# Patient Record
Sex: Male | Born: 2005
Health system: Southern US, Community
[De-identification: ages and names within clinical notes are randomized; demographics above are authoritative.]

## PROBLEM LIST (undated history)

## (undated) DIAGNOSIS — T7840XA Allergy, unspecified, initial encounter: Secondary | ICD-10-CM

## (undated) HISTORY — PX: CIRCUMCISION: SUR203

## (undated) HISTORY — DX: Allergy, unspecified, initial encounter: T78.40XA

---
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
## 2024-02-11 ENCOUNTER — Encounter (HOSPITAL_BASED_OUTPATIENT_CLINIC_OR_DEPARTMENT_OTHER): Payer: Self-pay

## 2024-02-11 ENCOUNTER — Other Ambulatory Visit: Payer: Self-pay

## 2024-02-11 ENCOUNTER — Emergency Department (HOSPITAL_BASED_OUTPATIENT_CLINIC_OR_DEPARTMENT_OTHER)
Admission: EM | Admit: 2024-02-11 | Discharge: 2024-02-12 | Disposition: A | Discharge status: Home / Self Care | Payer: BC Managed Care – PPO | Attending: Emergency Medicine | Admitting: Emergency Medicine

## 2024-02-11 DIAGNOSIS — Y9365 Activity, lacrosse and field hockey: Secondary | ICD-10-CM | POA: Diagnosis not present

## 2024-02-11 DIAGNOSIS — S0990XA Unspecified injury of head, initial encounter: Secondary | ICD-10-CM | POA: Diagnosis present

## 2024-02-11 DIAGNOSIS — W228XXA Striking against or struck by other objects, initial encounter: Secondary | ICD-10-CM | POA: Insufficient documentation

## 2024-02-11 DIAGNOSIS — S060X0A Concussion without loss of consciousness, initial encounter: Secondary | ICD-10-CM | POA: Insufficient documentation

## 2024-02-11 MED ORDER — OXYCODONE-ACETAMINOPHEN 5-325 MG PO TABS
1.0000 | ORAL_TABLET | ORAL | Status: DC | PRN
  Administered 2024-02-11: 1 via ORAL
  Filled 2024-02-11: qty 1

## 2024-02-11 NOTE — ED Triage Notes (Signed)
 Pt presents via POV c/o head injury while playing lacrosse. Reports hit in head during game. Reports headache, dizziness, and blurred vision.   Reports hx of concussion in past x2

## 2024-02-12 NOTE — ED Provider Notes (Signed)
 [place] EMERGENCY DEPARTMENT AT [facility] Provider Note   CSN: [id number] Arrival date & time: 02/11/24  2137     History  Chief Complaint  Patient presents with   Head Injury    Perry Dougherty is a 18 y.o. male.  The history is provided by the patient and a parent.   Patient presents after sustaining a head injury during a lacrosse game.  This occurred around 7 PM on February 25.  Patient reports that he was "blown up" by another player when he was trying to pass the ball.  No LOC.  He did not fall to the ground, but did start having a headache and blurred vision.  He continued to play and actually scored a goal afterwards. He was able to finish the game and after going home he had worsening headache and nausea.  He has had mild dizziness  no vomiting, no focal weakness, no mental status changes. No staggering, no difficulty walking or slurred speech per father He has been resting since that time and is starting to feel improved.  He reports his vision is back to baseline and no dizziness   Has had 2 previous concussions is been seen by sports medicine previously for this. He is otherwise healthy at baseline Home Medications Prior to Admission medications   Medication Sig Start Date End Date Taking? Authorizing Provider  meloxicam (MOBIC) 15 MG tablet Take 1 tablet (15 mg total) by mouth daily. 05/07/23   Richardean Sale, DO  mupirocin ointment (BACTROBAN) 2 % Apply 1 Application topically 2 (two) times daily. 12/24/23   Myles Gip, DO      Allergies    Patient has no known allergies.    Review of Systems   Review of Systems  HENT:  Negative for hearing loss and tinnitus.   Eyes:  Positive for visual disturbance.  Gastrointestinal:  Positive for nausea. Negative for vomiting.  Neurological:  Positive for dizziness and headaches. Negative for syncope, speech difficulty, weakness and numbness.    Physical Exam Updated Vital Signs BP 115/70    Pulse 75   Temp 98 F (36.7 C) (Oral)   Resp 16   Ht 1.854 m (6\' 1" )   Wt 68 kg   SpO2 99%   BMI 19.79 kg/m  Physical Exam CONSTITUTIONAL: Well developed/well nourished HEAD: Normocephalic/atraumatic, no signs of trauma, no bruising EYES: EOMI/PERRL, no nystagmus,  no ptosis ENMT: Mucous membranes moist, no facial trauma NECK: supple no meningeal signs No cervical spine tenderness Chest-no bruising or tenderness, no abdominal tenderness NEURO:Awake/alert, face symmetric, no arm or leg drift is noted Equal 5/5 strength with shoulder abduction, elbow flex/extension, wrist flex/extension in upper extremities and equal hand grips bilaterally Equal 5/5 strength with hip flexion,knee flex/extension, foot dorsi/plantar flexion Cranial nerves 3/4/5/6/06/24/09/11/12 tested and intact Gait normal without ataxia No past pointing Sensation to light touch intact in all extremities EXTREMITIES: pulses normal, full ROM SKIN: warm, color normal PSYCH: no abnormalities of mood noted  ED Results / Procedures / Treatments   Labs (all labs ordered are listed, but only abnormal results are displayed) Labs Reviewed - No data to display  EKG None  Radiology No results found.  Procedures Procedures    Medications Ordered in ED Medications  oxyCODONE-acetaminophen (PERCOCET/ROXICET) 5-325 MG per tablet 1 tablet (1 tablet Oral Given 02/11/24 2154)    ED Course/ Medical Decision Making/ A&P           Glasgow Coma Scale Score: 15  NEXUS Criteria Score: 0                Medical Decision Making Risk Prescription drug management.   This patient presents to the ED for concern of head injury, this involves an extensive number of treatment options, and is a complaint that carries with it a high risk of complications and morbidity.  The differential diagnosis includes but is not limited to subdural hematoma, subarachnoid hemorrhage, skull fracture, concussion    Comorbidities that  complicate the patient evaluation: Patient's presentation is complicated by their history of previous concussion  Social Determinants of Health: Patient's  currently high school student   increases the complexity of managing their presentation  Additional history obtained: Additional history obtained from family Records reviewed Primary Care Documents  Medicines ordered and prescription drug management: I ordered medication including Percocet for pain Reevaluation of the patient after these medicines showed that the patient    improved  Test Considered: Since it has been several hours since his injury, no LOC or vomiting, GCS of 15 and back to his baseline, no indication for neuroimaging at this time  Reevaluation: After the interventions noted above, I reevaluated the patient and found that they have :improved  Complexity of problems addressed: Patient's presentation is most consistent with  acute presentation with potential threat to life or bodily function  Disposition: After consideration of the diagnostic results and the patient's response to treatment,  I feel that the patent would benefit from discharge   .    No indication for neuroimaging given his history and exam.  He is very well-appearing.  No neurodeficits and he walks around the room in no distress. He was informed that he cannot return to sports and must be seen as an outpatient by his sports medicine physician.  Patient and father were agreeable w/plan        Final Clinical Impression(s) / ED Diagnoses Final diagnoses:  Concussion without loss of consciousness, initial encounter    Rx / DC Orders ED Discharge Orders     None         Zadie Rhine, MD 02/12/24 0139

## 2024-02-13 ENCOUNTER — Ambulatory Visit (INDEPENDENT_AMBULATORY_CARE_PROVIDER_SITE_OTHER): Payer: BC Managed Care – PPO | Admitting: Sports Medicine

## 2024-02-13 VITALS — BP 132/80 | HR 67 | Ht 73.0 in | Wt 150.0 lb

## 2024-02-13 DIAGNOSIS — S060X0A Concussion without loss of consciousness, initial encounter: Secondary | ICD-10-CM | POA: Diagnosis not present

## 2024-02-13 NOTE — Patient Instructions (Addendum)
-   Goal of sleeping a minimum of 7-8 continuous hours nightly -Recommend light physical activity for 15-30 minutes a day while keeping symptoms less than 3/10 -Stop mental or physical activities that cause symptoms to worsen greater than 3/10, and wait 24 hours before attempting them again -Eliminate screen time as much as possible for first 48 hours after concussive event, then continue limited screen time (recommend less than 2 hours per day) Not cleared for athletic participation at this time. Tylenol for daily pain relief. Personally would recommend resting this weekend. Can return to school starting tomorrow with accommodations. Follow up in 1 week.

## 2024-02-13 NOTE — Progress Notes (Signed)
 Aleen Sells D.[facility] [address] [place] [address] Phone: [phone]  Assessment and Plan:     1. Concussion without loss of consciousness, initial encounter  -Acute, complicated, initial sports medicine visit - Concussion diagnosed based on HPI, physical exam, symptom severity score, ER notes - May return to school starting tomorrow with accommodations.  Recommend maximum 1 test/quiz per day.  Take rest breaks as needed.  Extended time for coursework - Not cleared to return to physical activity at this time - Patient has a college visit to [employer].  Can participate, though should use 3/10 threshold and take rest breaks as needed - Can drive as tolerated if asymptomatic - Patient has a youth group trip this weekend which will involve outdoor activities, physical activity planting trees.  Personally I would recommend not participating in this trip as physical activity, new sleeping arrangements could prolong patient's recovery. - Discussed with patient and his mother that this is patient's third diagnosed concussion.  Last diagnosed concussion occurred on 02/27/2022 and had completely resolved within 4 weeks.  We discussed that I do not see any red flag symptoms at this time that make me think the patient should stop lacrosse or physical activity.  If patient has recovered from this concussion in a typical 2 to 4-week timeframe without red flag symptoms, I would provide full clearance to return to lacrosse without restrictions  Date of injury was 02/11/2024.  Original symptom severity scores were 10 and 27.   Recommendations:  -  Goal of sleeping a minimum of 7-8 continuous hours nightly - Recommend light physical activity for 15-30 minutes a day while keeping symptoms less than 3/10 - Stop mental or physical activities that cause symptoms to worsen greater than 3/10, and wait 24 hours before attempting them again - Eliminate screen time as much as  possible for first 48 hours after concussive event, then continue limited screen time (recommend less than 2 hours per day)  Pertinent previous records reviewed include ER note 02/11/24  - Encouraged to RTC in 1 week for reassessment or sooner for any concerns or acute changes   Patient accompanied by his mother throughout entirety of office visit  Time of visit 48 minutes, which included chart review, physical exam, treatment plan, symptom severity score, VOMS, and tandem gait testing being performed, interpreted, and discussed with patient at today's visit.   Subjective:   I, Jerene Canny, am serving as a Neurosurgeon for Doctor Richardean Sale  Chief Complaint: concussion symptoms   HPI:   02/13/24 Patient is a 18 year old male with concussion symptoms . Patient states was seen in ED  sustaining a head injury during a lacrosse game.  This occurred around 7 PM on February 25.  Patient reports that he was "blown up" by another player when he was trying to pass the ball.  No LOC.  He did not fall to the ground, but did start having a headache and blurred vision.  He continued to play and actually scored a goal afterwards.He was able to finish the game and after going home he had worsening headache and nausea.  He has had mild dizziness no vomiting, no focal weakness, no mental status changes. No staggering, no difficulty walking or slurred speech per father He has been resting since that time and is starting to feel improved.  He reports his vision is back to baseline and no dizziness Has had 2 previous concussions is been seen by  sports medicine previously for this. 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 No Diagnosed/treated for headache disorder, migraines, or seizures? No Diagnosed with learning disability Elnita Maxwell? No Diagnosed with ADD/ADHD? No Diagnose with Depression, anxiety, or other Psychiatric Disorder? No   Current medications:  Current Outpatient Medications  Medication Sig Dispense Refill   meloxicam (MOBIC) 15 MG tablet Take 1 tablet (15 mg total) by mouth daily. 30 tablet 0   mupirocin ointment (BACTROBAN) 2 % Apply 1 Application topically 2 (two) times daily. 22 g 0   No current facility-administered medications for this visit.      Objective:     Vitals:   02/13/24 1056  BP: 132/80  Pulse: 67  SpO2: 100%  Weight: 150 lb (68 kg)  Height: 6\' 1"  (1.854 m)      Body mass index is 19.79 kg/m.    Physical Exam:     General: Well-appearing, cooperative, sitting comfortably in no acute distress.  Psychiatric: Mood and affect are appropriate.   Neuro:sensation intact and strength 5/5 with no deficits, no atrophy, normal muscle tone   Today's Symptom Severity Score:  Scores: 0-6  Headache:5 "Pressure in head":4  Neck Pain:0 Nausea or vomiting:2 Dizziness:2 Blurred vision:0 Balance problems:0 Sensitivity to light:6 Sensitivity to noise:2 Feeling slowed down:1 Feeling like "in a fog":0 "Don't feel right":0 Difficulty concentrating:3 Difficulty remembering:2  Fatigue or low energy:0 Confusion:0  Drowsiness:0  More emotional:0 Irritability:0 Sadness:0  Nervous or Anxious:0 Trouble falling or staying asleep:6  Total number of symptoms: 10/22  Symptom Severity index: 27/132  Worse with physical activity? Yes  Worse with mental activity? Yes  Percent improved since injury: 70%    Full pain-free cervical PROM: yes     Cognitive:  - Months backwards: 0 Mistakes.  13 seconds  mVOMS:   - Baseline symptoms: 0 - Horizontal Vestibular-Ocular Reflex: Head pressure 2/10  - Smooth pursuits: 0/10  - Horizontal Saccades:  0/10  -  Visual Motion Sensitivity Test:  0/10  - Convergence: 4,4cm (<5 cm normal)    Autonomic:  - Symptomatic with supine to standing: no  Complex Tandem Gait: - Forward, eyes open: 0 errors - Backward, eyes open: 0 errors - Forward, eyes closed: 3 errors - Backward, eyes closed: 4 errors  Electronically signed by:  Aleen Sells D.[facility] 11:48 AM 02/13/24

## 2024-02-25 NOTE — Progress Notes (Unsigned)
 Aleen Sells D.[facility] [address] [place] [address] Phone: [phone]  Assessment and Plan:     There are no diagnoses linked to this encounter.  ***    Date of injury was 02/11/2024.  Symptom severity scores of *** and *** today.  Original symptom severity scores were 10 and 27.   Recommendations:  -  Goal of sleeping a minimum of 7-8 continuous hours nightly - Recommend light physical activity for 15-30 minutes a day while keeping symptoms less than 3/10 - Stop mental or physical activities that cause symptoms to worsen greater than 3/10, and wait 24 hours before attempting them again - Eliminate screen time as much as possible for first 48 hours after concussive event, then continue limited screen time (recommend less than 2 hours per day)  Pertinent previous records reviewed include ***    - Encouraged to RTC in *** for reassessment or sooner for any concerns or acute changes    Time of visit *** minutes, which included chart review, physical exam, treatment plan, symptom severity score, VOMS, and tandem gait testing being performed, interpreted, and discussed with patient at today's visit.   Subjective:   I, Jerene Canny, am serving as a Neurosurgeon for Doctor Richardean Sale   Chief Complaint: concussion symptoms    HPI:    02/13/24 Patient is a 18 year old male with concussion symptoms . Patient states was seen in ED  sustaining a head injury during a lacrosse game.  This occurred around 7 PM on February 25.  Patient reports that he was "blown up" by another player when he was trying to pass the ball.  No LOC.  He did not fall to the ground, but did start having a headache and blurred vision.  He continued to play and actually scored a goal afterwards.He was able to finish the game and after going home he had worsening headache and nausea.  He has had mild dizziness no vomiting, no focal weakness, no mental status changes. No staggering,  no difficulty walking or slurred speech per father He has been resting since that time and is starting to feel improved.  He reports his vision is back to baseline and no dizziness Has had 2 previous concussions is been seen by sports medicine previously for this. 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 No Diagnosed/treated for headache disorder, migraines, or seizures? No Diagnosed with learning disability Elnita Maxwell? No Diagnosed with ADD/ADHD? No Diagnose with Depression, anxiety, or other Psychiatric Disorder? No Current medications:  Current Outpatient Medications  Medication Sig Dispense Refill   meloxicam (MOBIC) 15 MG tablet Take 1 tablet (15 mg total) by mouth daily. 30 tablet 0   mupirocin ointment (BACTROBAN) 2 % Apply 1 Application topically 2 (two) times daily. 22 g 0   No current facility-administered medications for this visit.      Objective:     There were no vitals filed for this visit.    There is no height or weight on file to calculate BMI.    Physical Exam:  General: Well-appearing, cooperative, sitting comfortably in no acute distress.  Psychiatric: Mood and affect are appropriate.   Neuro:sensation intact and strength 5/5 with no deficits, no atrophy, normal muscle tone   Today's Symptom Severity Score:  Scores: 0-6  Headache:*** "Pressure in head":***  Neck Pain:*** Nausea or vomiting:*** Dizziness:*** Blurred vision:*** Balance problems:*** Sensitivity to light:*** Sensitivity to noise:*** Feeling slowed down:*** Feeling like "in a fog":*** "Don't feel right":*** Difficulty concentrating:*** Difficulty  remembering:***  Fatigue or low energy:*** Confusion:***  Drowsiness:***  More emotional:*** Irritability:*** Sadness:***  Nervous or Anxious:*** Trouble falling or staying asleep:***  Total number of symptoms: ***/22  Symptom Severity index: ***/132  Worse with physical activity? No*** Worse with mental activity? No*** Percent improved since injury: ***%    Full pain-free cervical PROM: yes***    Cognitive:  - Months backwards: *** Mistakes. *** seconds  mVOMS:   - Baseline symptoms: *** - Horizontal Vestibular-Ocular Reflex: ***/10  - Smooth pursuits: ***/10  - Horizontal Saccades:  ***/10  - Visual Motion Sensitivity Test:  ***/10  - Convergence: ***cm (<5 cm normal)    Autonomic:  - Symptomatic with supine to standing: No***  Complex Tandem Gait: - Forward, eyes open: *** errors - Backward, eyes open: *** errors - Forward, eyes closed: *** errors - Backward, eyes closed: *** errors  Electronically signed by:  Aleen Sells [facility] 7:40 AM 02/25/24

## 2024-02-26 ENCOUNTER — Ambulatory Visit (INDEPENDENT_AMBULATORY_CARE_PROVIDER_SITE_OTHER): Admitting: Sports Medicine

## 2024-02-26 VITALS — BP 108/78 | Ht 73.0 in | Wt 150.0 lb

## 2024-02-26 DIAGNOSIS — S060X0D Concussion without loss of consciousness, subsequent encounter: Secondary | ICD-10-CM

## 2024-02-26 NOTE — Patient Instructions (Signed)
 As needed follow up.

## 2024-02-28 ENCOUNTER — Ambulatory Visit: Admitting: Sports Medicine

## 2024-04-23 ENCOUNTER — Encounter: Payer: Self-pay | Admitting: Pediatrics

## 2024-04-23 ENCOUNTER — Ambulatory Visit: Admitting: Pediatrics

## 2024-04-23 VITALS — Wt 150.0 lb

## 2024-04-23 DIAGNOSIS — K640 First degree hemorrhoids: Secondary | ICD-10-CM | POA: Diagnosis not present

## 2024-04-23 MED ORDER — HYDROCORTISONE ACETATE 1 % EX OINT: 1.0000 "application " | TOPICAL_OINTMENT | Freq: Two times a day (BID) | CUTANEOUS | 3 refills | Status: AC

## 2024-04-23 NOTE — Patient Instructions (Signed)
 Hemorrhoids Hemorrhoids are swollen veins in and around the rectum or the opening of the butt (anus). There are two types of hemorrhoids: Internal. These occur in the veins just inside the rectum. They may poke through to the outside and become irritated and painful. External. These occur in the veins outside the anus. They can be felt as a painful swelling or hard lump near the anus. Most hemorrhoids do not cause severe problems. Often, they can be treated at home with diet and lifestyle changes. If home treatments do not help, you may need a procedure to shrink or remove the hemorrhoids. What are the causes? Hemorrhoids are caused by pressure near the anus. This pressure may be caused by: Constipation or diarrhea. Straining to poop. Pregnancy. Obesity. Sitting or riding a bike for a long time. Heavy lifting or other things that cause you to strain.  What are the signs or symptoms? Symptoms of this condition include: Pain. Anal itching or irritation. Bleeding from the rectum. Leakage of poop (stool). Swelling of the anus. One or more lumps around the anus. How is this diagnosed? Hemorrhoids can often be diagnosed through a visual exam. Other exams or tests may also be done, such as: A digital rectal exam. This is when your health care provider feels inside your rectum with a gloved finger. Anoscope. This is an exam of the anus using a small tube. A blood test, if you have lost a lot of blood. A sigmoidoscopy or colonoscopy. These are tests to look inside the colon using a tube with a camera on the end. How is this treated? In most cases, hemorrhoids can be treated at home with diet and lifestyle changes. If these changes do not help, you may need to have a procedure done. These procedures can make the hemorrhoids smaller or fully remove them. Common procedures include: Rubber band ligation. Rubber bands are placed at the base of the hemorrhoids to cut off their blood  supply. Sclerotherapy. Medicine is put into the hemorrhoids to shrink them. Infrared coagulation. A type of light energy is used to get rid of the hemorrhoids. Hemorrhoidectomy surgery. The hemorrhoids are removed during surgery. Then, the veins that supply them are tied off. Stapled hemorrhoidopexy surgery. The base of the hemorrhoid is stapled to the wall of the rectum. Follow these instructions at home: Medicines Take over-the-counter and prescription medicines only as told by your provider. Use medicated creams or medicines that are put in the rectum (suppositories) as told by your provider. Eating and drinking  Eat foods that are high in fiber, such as beans, whole grains, and fresh fruits and vegetables. Ask your provider about taking products that have fiber added to them (fiber supplements). Reduce the amount of fat in your diet. You can do this by eating low-fat dairy products, eating less red meat, and avoiding processed foods. Drink enough fluid to keep your pee (urine) pale yellow. Managing pain and swelling  Take warm sitz baths for 20 minutes, 3-4 times a day. This can help ease pain and discomfort. You may do this in a bathtub or you can use a portable sitz bath that fits over the toilet. If told, put ice on the affected area. It may help to use ice packs between sitz baths. Put ice in a plastic bag. Place a towel between your skin and the bag. Leave the ice on for 20 minutes, 2-3 times a day. If your skin turns bright red, remove the ice right away to prevent skin  damage. The risk of damage is higher if you cannot feel pain, heat, or cold. General instructions Exercise. Ask your provider how much and what kind of exercise is best for you. In general, you should do moderate exercise for at least 30 minutes on most days of the week (150 minutes each week). You may want to try walking, biking, or yoga. Go to the bathroom when you have the urge to poop. Do not wait. Avoid  straining to poop. Keep the anus dry and clean. Use wet toilet paper or moist towelettes after you poop. Do not sit on the toilet for a long time. This can increase blood pooling and pain. Where to find more information [employer] of Diabetes and Digestive and Kidney Diseases: [employer] Contact a health care provider if: You have more pain and swelling that do not get better with treatment. You have trouble pooping or you are not able to poop. You have pain or inflammation outside the area of the hemorrhoids. Get help right away if: You are bleeding from your rectum and you cannot get it to stop. This information is not intended to replace advice given to you by your health care provider. Make sure you discuss any questions you have with your health care provider. Document Revised: 08/15/2022 Document Reviewed: 08/15/2022 Elsevier Patient Education  2024 [employer].

## 2024-04-23 NOTE — Progress Notes (Signed)
 Subjective:     Perry Dougherty is a 18 y.o. male who presents for evaluation of rectal bleeding. Onset of symptoms was gradual. Symptoms have been unchanged since that time. Symptoms include: anorectal itching and bleeding which only occurs with bowel movements. Patient denies known or suspected STD exposure, maroon colored stools, melena, and weight loss. Treatment to date has been none.  The following portions of the patient's history were reviewed and updated as appropriate: allergies, current medications, past family history, past medical history, past social history, past surgical history, and problem list.  Review of Systems Pertinent items are noted in HPI.   Objective:    Wt 150 lb (68 kg)   BMI 19.79 kg/m   Constitutional: Appears well-developed and well-nourished.   HENT:  Right Ear: Tympanic membrane normal.  Left Ear: Tympanic membrane normal.  Nose: Mucoid nasal discharge.  Mouth/Throat: Mucous membranes are moist. No dental caries. No tonsillar exudate.  Eyes: normal  Neck: Normal range of motion.   Cardiovascular: Regular rhythm.  No murmur heard. Pulmonary/Chest: Effort normal and breath sounds normal. No nasal flaring. No respiratory distress. No wheezes and  exhibits no retraction.  Abdominal: Soft. Bowel sounds are normal. There is no tenderness.  Musculoskeletal: Normal range of motion.  Neurological: Alert and oriented Skin: Skin is warm and moist. No rash noted.  Rectal: small hemorrhoid at 9 o clock  --no active bleeding   Assessment:    External hemorrhoid Internal hemorrhoids   Plan:    Discussed the diagnosis with the patient, including plans for treatment and expected course. Discussed symptomatic and preventative measures regarding hemorrhoidal disease. Recommended fiber supplementation. Anusol suppositories as needed.  Short term use only.

## 2024-08-27 ENCOUNTER — Telehealth: Payer: Self-pay | Admitting: Pediatrics

## 2024-08-27 MED ORDER — LORATADINE 10 MG PO TABS: 10.0000 mg | ORAL_TABLET | Freq: Every day | ORAL | 12 refills | Status: AC

## 2024-08-27 NOTE — Telephone Encounter (Signed)
 Filled med

## 2024-08-27 NOTE — Telephone Encounter (Signed)
 Pt's dad requested Loratadine  be sent to [facility], [place] ).  He asked if a note could be put in stating Needs to be filled and delivered to [facility].

## 2024-10-24 ENCOUNTER — Other Ambulatory Visit: Payer: Self-pay | Admitting: Pediatrics

## 2024-12-03 ENCOUNTER — Ambulatory Visit: Payer: Self-pay

## 2024-12-03 ENCOUNTER — Other Ambulatory Visit: Payer: Self-pay | Admitting: Pediatrics

## 2024-12-03 MED ORDER — OFLOXACIN 0.3 % OP SOLN: 1.0000 [drp] | Freq: Three times a day (TID) | OPHTHALMIC | 0 refills | Status: AC
